# Patient Record
Sex: Female | Born: 1960 | Race: Black or African American | Hispanic: No | Marital: Married | Smoking: Former smoker
Health system: Southern US, Community
[De-identification: ages and names within clinical notes are randomized; demographics above are authoritative.]

## PROBLEM LIST (undated history)

## (undated) HISTORY — PX: TUBAL LIGATION: SHX77

---

## 2000-08-07 ENCOUNTER — Other Ambulatory Visit: Admission: RE | Admit: 2000-08-07 | Discharge: 2000-08-07 | Payer: Self-pay | Admitting: *Deleted

## 2000-08-16 ENCOUNTER — Ambulatory Visit (HOSPITAL_COMMUNITY): Admission: RE | Admit: 2000-08-16 | Discharge: 2000-08-16 | Payer: Self-pay | Admitting: *Deleted

## 2000-08-16 ENCOUNTER — Encounter: Payer: Self-pay | Admitting: Family Medicine

## 2001-11-26 ENCOUNTER — Encounter: Payer: Self-pay | Admitting: Family Medicine

## 2001-11-26 ENCOUNTER — Ambulatory Visit (HOSPITAL_COMMUNITY): Admission: RE | Admit: 2001-11-26 | Discharge: 2001-11-26 | Payer: Self-pay | Admitting: Family Medicine

## 2002-12-11 ENCOUNTER — Ambulatory Visit (HOSPITAL_COMMUNITY): Admission: RE | Admit: 2002-12-11 | Discharge: 2002-12-11 | Payer: Self-pay | Admitting: Family Medicine

## 2003-01-16 ENCOUNTER — Emergency Department (HOSPITAL_COMMUNITY): Admission: EM | Admit: 2003-01-16 | Discharge: 2003-01-16 | Payer: Self-pay | Admitting: Emergency Medicine

## 2004-01-15 ENCOUNTER — Ambulatory Visit (HOSPITAL_COMMUNITY): Admission: RE | Admit: 2004-01-15 | Discharge: 2004-01-15 | Payer: Self-pay | Admitting: Family Medicine

## 2004-01-15 ENCOUNTER — Other Ambulatory Visit: Admission: RE | Admit: 2004-01-15 | Discharge: 2004-01-15 | Payer: Self-pay | Admitting: Family Medicine

## 2005-09-07 ENCOUNTER — Ambulatory Visit (HOSPITAL_COMMUNITY): Admission: RE | Admit: 2005-09-07 | Discharge: 2005-09-07 | Payer: Self-pay | Admitting: Family Medicine

## 2005-09-13 ENCOUNTER — Other Ambulatory Visit: Admission: RE | Admit: 2005-09-13 | Discharge: 2005-09-13 | Payer: Self-pay | Admitting: Family Medicine

## 2005-09-14 ENCOUNTER — Encounter: Admission: RE | Admit: 2005-09-14 | Discharge: 2005-09-14 | Payer: Self-pay | Admitting: Family Medicine

## 2006-09-10 ENCOUNTER — Ambulatory Visit (HOSPITAL_COMMUNITY): Admission: RE | Admit: 2006-09-10 | Discharge: 2006-09-10 | Payer: Self-pay | Admitting: Family Medicine

## 2006-10-05 ENCOUNTER — Other Ambulatory Visit: Admission: RE | Admit: 2006-10-05 | Discharge: 2006-10-05 | Payer: Self-pay | Admitting: Family Medicine

## 2006-10-12 ENCOUNTER — Ambulatory Visit (HOSPITAL_COMMUNITY): Admission: RE | Admit: 2006-10-12 | Discharge: 2006-10-12 | Payer: Self-pay | Admitting: Family Medicine

## 2007-02-20 ENCOUNTER — Ambulatory Visit (HOSPITAL_COMMUNITY): Admission: RE | Admit: 2007-02-20 | Discharge: 2007-02-20 | Payer: Self-pay | Admitting: Family Medicine

## 2007-09-20 ENCOUNTER — Ambulatory Visit (HOSPITAL_COMMUNITY): Admission: RE | Admit: 2007-09-20 | Discharge: 2007-09-20 | Payer: Self-pay | Admitting: Family Medicine

## 2007-09-27 ENCOUNTER — Encounter: Admission: RE | Admit: 2007-09-27 | Discharge: 2007-09-27 | Payer: Self-pay | Admitting: Family Medicine

## 2007-10-07 ENCOUNTER — Other Ambulatory Visit: Admission: RE | Admit: 2007-10-07 | Discharge: 2007-10-07 | Payer: Self-pay | Admitting: Family Medicine

## 2008-03-16 ENCOUNTER — Encounter: Admission: RE | Admit: 2008-03-16 | Discharge: 2008-03-16 | Payer: Self-pay | Admitting: Family Medicine

## 2008-10-30 ENCOUNTER — Other Ambulatory Visit: Admission: RE | Admit: 2008-10-30 | Discharge: 2008-10-30 | Payer: Self-pay | Admitting: Family Medicine

## 2008-11-06 ENCOUNTER — Ambulatory Visit (HOSPITAL_COMMUNITY): Admission: RE | Admit: 2008-11-06 | Discharge: 2008-11-06 | Payer: Self-pay | Admitting: Family Medicine

## 2009-11-01 ENCOUNTER — Other Ambulatory Visit: Admission: RE | Admit: 2009-11-01 | Discharge: 2009-11-01 | Payer: Self-pay | Admitting: Family Medicine

## 2010-02-27 ENCOUNTER — Encounter: Payer: Self-pay | Admitting: Family Medicine

## 2010-03-10 ENCOUNTER — Other Ambulatory Visit: Payer: Self-pay | Admitting: Family Medicine

## 2010-03-10 DIAGNOSIS — Z1239 Encounter for other screening for malignant neoplasm of breast: Secondary | ICD-10-CM

## 2010-03-23 ENCOUNTER — Ambulatory Visit
Admission: RE | Admit: 2010-03-23 | Discharge: 2010-03-23 | Disposition: A | Discharge status: Home / Self Care | Payer: BC Managed Care – PPO | Source: Ambulatory Visit | Attending: Family Medicine | Admitting: Family Medicine

## 2010-03-23 DIAGNOSIS — Z1239 Encounter for other screening for malignant neoplasm of breast: Secondary | ICD-10-CM

## 2010-11-21 ENCOUNTER — Other Ambulatory Visit: Payer: Self-pay | Admitting: Family Medicine

## 2010-11-21 ENCOUNTER — Other Ambulatory Visit (HOSPITAL_COMMUNITY)
Admission: RE | Admit: 2010-11-21 | Discharge: 2010-11-21 | Disposition: A | Discharge status: Home / Self Care | Payer: BC Managed Care – PPO | Source: Ambulatory Visit | Attending: Family Medicine | Admitting: Family Medicine

## 2010-11-21 DIAGNOSIS — Z01419 Encounter for gynecological examination (general) (routine) without abnormal findings: Secondary | ICD-10-CM | POA: Insufficient documentation

## 2011-07-07 ENCOUNTER — Other Ambulatory Visit: Payer: Self-pay | Admitting: Family Medicine

## 2011-10-25 ENCOUNTER — Other Ambulatory Visit: Payer: Self-pay | Admitting: Family Medicine

## 2011-10-25 DIAGNOSIS — Z1231 Encounter for screening mammogram for malignant neoplasm of breast: Secondary | ICD-10-CM

## 2011-11-21 ENCOUNTER — Ambulatory Visit
Admission: RE | Admit: 2011-11-21 | Discharge: 2011-11-21 | Disposition: A | Discharge status: Home / Self Care | Payer: BC Managed Care – PPO | Source: Ambulatory Visit | Attending: Family Medicine | Admitting: Family Medicine

## 2011-11-21 DIAGNOSIS — Z1231 Encounter for screening mammogram for malignant neoplasm of breast: Secondary | ICD-10-CM

## 2011-12-05 ENCOUNTER — Other Ambulatory Visit (HOSPITAL_COMMUNITY)
Admission: RE | Admit: 2011-12-05 | Discharge: 2011-12-05 | Disposition: A | Discharge status: Home / Self Care | Payer: BC Managed Care – PPO | Source: Ambulatory Visit | Attending: Family Medicine | Admitting: Family Medicine

## 2011-12-05 ENCOUNTER — Other Ambulatory Visit: Payer: Self-pay | Admitting: Family Medicine

## 2011-12-05 DIAGNOSIS — Z01419 Encounter for gynecological examination (general) (routine) without abnormal findings: Secondary | ICD-10-CM | POA: Insufficient documentation

## 2013-11-17 ENCOUNTER — Other Ambulatory Visit: Payer: Self-pay

## 2013-11-17 DIAGNOSIS — Z1239 Encounter for other screening for malignant neoplasm of breast: Secondary | ICD-10-CM

## 2013-11-27 ENCOUNTER — Other Ambulatory Visit: Payer: Self-pay

## 2013-11-27 DIAGNOSIS — Z1231 Encounter for screening mammogram for malignant neoplasm of breast: Secondary | ICD-10-CM

## 2013-12-01 ENCOUNTER — Ambulatory Visit
Admission: RE | Admit: 2013-12-01 | Discharge: 2013-12-01 | Disposition: A | Discharge status: Home / Self Care | Payer: BC Managed Care – PPO | Source: Ambulatory Visit

## 2013-12-01 DIAGNOSIS — Z1231 Encounter for screening mammogram for malignant neoplasm of breast: Secondary | ICD-10-CM

## 2014-07-24 ENCOUNTER — Emergency Department (HOSPITAL_COMMUNITY)
Admission: EM | Admit: 2014-07-24 | Discharge: 2014-07-24 | Disposition: A | Discharge status: Home / Self Care | Payer: BC Managed Care – PPO | Attending: Emergency Medicine | Admitting: Emergency Medicine

## 2014-07-24 ENCOUNTER — Encounter (HOSPITAL_COMMUNITY): Payer: Self-pay

## 2014-07-24 DIAGNOSIS — R110 Nausea: Secondary | ICD-10-CM | POA: Diagnosis not present

## 2014-07-24 DIAGNOSIS — R42 Dizziness and giddiness: Secondary | ICD-10-CM | POA: Diagnosis present

## 2014-07-24 DIAGNOSIS — Z87891 Personal history of nicotine dependence: Secondary | ICD-10-CM | POA: Insufficient documentation

## 2014-07-24 DIAGNOSIS — H8110 Benign paroxysmal vertigo, unspecified ear: Secondary | ICD-10-CM | POA: Diagnosis not present

## 2014-07-24 LAB — I-STAT CHEM 8, ED
BUN: 10 mg/dL (ref 6–20)
CHLORIDE: 106 mmol/L (ref 101–111)
Calcium, Ion: 1.19 mmol/L (ref 1.12–1.23)
Creatinine, Ser: 0.6 mg/dL (ref 0.44–1.00)
Glucose, Bld: 95 mg/dL (ref 65–99)
HCT: 42 % (ref 36.0–46.0)
Hemoglobin: 14.3 g/dL (ref 12.0–15.0)
POTASSIUM: 3.6 mmol/L (ref 3.5–5.1)
SODIUM: 141 mmol/L (ref 135–145)
TCO2: 23 mmol/L (ref 0–100)

## 2014-07-24 MED ORDER — SODIUM CHLORIDE 0.9 % IV BOLUS (SEPSIS)
1000.0000 mL | Freq: Once | INTRAVENOUS | Status: AC
  Administered 2014-07-24: 1000 mL via INTRAVENOUS

## 2014-07-24 MED ORDER — DIAZEPAM 5 MG/ML IJ SOLN
5.0000 mg | Freq: Once | INTRAMUSCULAR | Status: AC
  Administered 2014-07-24: 5 mg via INTRAVENOUS
  Filled 2014-07-24: qty 2

## 2014-07-24 MED ORDER — MECLIZINE HCL 50 MG PO TABS: 50.0000 mg | ORAL_TABLET | Freq: Two times a day (BID) | ORAL | Status: AC | PRN

## 2014-07-24 MED ORDER — PROMETHAZINE HCL 25 MG PO TABS: 25.0000 mg | ORAL_TABLET | Freq: Four times a day (QID) | ORAL | Status: AC | PRN

## 2014-07-24 NOTE — ED Provider Notes (Signed)
The patient is a 54 year old female who presents with acute onset of vertiginous symptoms with moving her head. On exam the patient has reproducible symptoms with moving her head left or right and with sitting up. It fatigues after a couple of minutes of laying still. She has no other focal neurologic deficits, normal strength and sensation, after a while after being medicated she was able to ambulate to the bathroom with minimal assistance. Likely peripheral vertigo, stable for discharge.  After pt was dosed with meds, she improved rapidly and was eventually able to ambulate by herself multiple times down hallway - feels improved for d/c.  Counseled pt re: diagnosis and indications for return.  Medical screening examination/treatment/procedure(s) were conducted as a shared visit with non-physician practitioner(s) and myself.  I personally evaluated the patient during the encounter.  Clinical Impression:   Final diagnoses:  None         Eber Hong, MD 07/25/14 [phone]

## 2014-07-24 NOTE — Discharge Instructions (Signed)
Please follow with your primary care doctor in the next 2 days for a check-up. They must obtain records for further management.  ° °Do not hesitate to return to the Emergency Department for any new, worsening or concerning symptoms.  ° ° °Benign Positional Vertigo °Vertigo means you feel like you or your surroundings are moving when they are not. Benign positional vertigo is the most common form of vertigo. Benign means that the cause of your condition is not serious. Benign positional vertigo is more common in older adults. °CAUSES  °Benign positional vertigo is the result of an upset in the labyrinth system. This is an area in the middle ear that helps control your balance. This may be caused by a viral infection, head injury, or repetitive motion. However, often no specific cause is found. °SYMPTOMS  °Symptoms of benign positional vertigo occur when you move your head or eyes in different directions. Some of the symptoms may include: °· Loss of balance and falls. °· Vomiting. °· Blurred vision. °· Dizziness. °· Nausea. °· Involuntary eye movements (nystagmus). °DIAGNOSIS  °Benign positional vertigo is usually diagnosed by physical exam. If the specific cause of your benign positional vertigo is unknown, your caregiver may perform imaging tests, such as magnetic resonance imaging (MRI) or computed tomography (CT). °TREATMENT  °Your caregiver may recommend movements or procedures to correct the benign positional vertigo. Medicines such as meclizine, benzodiazepines, and medicines for nausea may be used to treat your symptoms. In rare cases, if your symptoms are caused by certain conditions that affect the inner ear, you may need surgery. °HOME CARE INSTRUCTIONS  °· Follow your caregiver's instructions. °· Move slowly. Do not make sudden body or head movements. °· Avoid driving. °· Avoid operating heavy machinery. °· Avoid performing any tasks that would be dangerous to you or others during a vertigo  episode. °· Drink enough fluids to keep your urine clear or pale yellow. °SEEK IMMEDIATE MEDICAL CARE IF:  °· You develop problems with walking, weakness, numbness, or using your arms, hands, or legs. °· You have difficulty speaking. °· You develop severe headaches. °· Your nausea or vomiting continues or gets worse. °· You develop visual changes. °· Your family or friends notice any behavioral changes. °· Your condition gets worse. °· You have a fever. °· You develop a stiff neck or sensitivity to light. °MAKE SURE YOU:  °· Understand these instructions. °· Will watch your condition. °· Will get help right away if you are not doing well or get worse. °Document Released: 10/31/2005 Document Revised: 04/17/2011 Document Reviewed: 10/13/2010 °[employer]® Patient Information ©2015 [employer]. This information is not intended to replace advice given to you by your health care provider. Make sure you discuss any questions you have with your health care provider. ° °

## 2014-07-24 NOTE — ED Notes (Signed)
[facility]- pt coming from home with sudden onset of dizziness. Pt denies headache. Pt a&o X4. No PMH. Vital signs stable.

## 2014-07-24 NOTE — ED Notes (Signed)
Pt ambulated to restroom and back to her room with no assistance. Pt tolerated well.

## 2014-07-24 NOTE — ED Notes (Signed)
Pt verbalizes understanding of d/c instructions and denies any further needs at this time. 

## 2014-07-24 NOTE — ED Provider Notes (Signed)
CSN: [id number]     Arrival date & time 07/24/14  1352 History   First MD Initiated Contact with Patient 07/24/14 1406     Chief Complaint  Patient presents with  . Dizziness     (Consider location/radiation/quality/duration/timing/severity/associated sxs/prior Treatment) HPI   Blood pressure 130/51, pulse 76, temperature 98.9 F (37.2 C), temperature source Oral, resp. rate 21, SpO2 100 %.  Elaine Carroll is a 54 y.o. female complaining of acute onset of dizziness which she describes as at noon today when she was in the grocery store. States she has nausea with no vomiting. She's never had a similar episode, patient denies headache, change in vision, dysarthria, cervicalgia, tinnitus, change in hearing, chest pain, shortness of breath, unilateral weakness. Patient states that the symptoms are alleviated when she closes her eyes and stays very still. She has no family history of CVA, she is a former smoker, nondiabetic no history of hypertension or hyperlipidemia.  History reviewed. No pertinent past medical history. Past Surgical History  Procedure Laterality Date  . Tubal ligation     History reviewed. No pertinent family history. History  Substance Use Topics  . Smoking status: Former Smoker    Quit date: 02/06/1974  . Smokeless tobacco: Not on file  . Alcohol Use: Yes     Comment: Occasional    OB History    No data available     Review of Systems  10 systems reviewed and found to be negative, except as noted in the HPI.   Allergies  Review of patient's allergies indicates no known allergies.  Home Medications   Prior to Admission medications   Medication Sig Start Date End Date Taking? Authorizing Provider  meclizine (ANTIVERT) 50 MG tablet Take 1 tablet (50 mg total) by mouth 2 (two) times daily as needed for dizziness or nausea. 07/24/14   Charmane Protzman, PA-C  promethazine (PHENERGAN) 25 MG tablet Take 1 tablet (25 mg total) by mouth every 6 (six) hours as  needed for nausea or vomiting. 07/24/14   Joni Reining Lakshmi Sundeen, PA-C   BP 114/53 mmHg  Pulse 80  Temp(Src) 98.9 F (37.2 C) (Oral)  Resp 17  SpO2 96% Physical Exam  Constitutional: She is oriented to person, place, and time. She appears well-developed and well-nourished.  HENT:  Head: Normocephalic and atraumatic.  Mouth/Throat: Oropharynx is clear and moist.  Eyes: Conjunctivae and EOM are normal. Pupils are equal, round, and reactive to light.  No TTP of maxillary or frontal sinuses  No TTP or induration of temporal arteries bilaterally  Neck: Normal range of motion. Neck supple.  FROM to C-spine. Pt can touch chin to chest without discomfort. No TTP of midline cervical spine.   Cardiovascular: Normal rate, regular rhythm and intact distal pulses.   Pulmonary/Chest: Effort normal and breath sounds normal. No respiratory distress. She has no wheezes. She has no rales. She exhibits no tenderness.  Abdominal: Soft. Bowel sounds are normal. There is no tenderness.  Musculoskeletal: Normal range of motion. She exhibits no edema or tenderness.  Neurological: She is alert and oriented to person, place, and time. No cranial nerve deficit.  In acute distress, crying, in fetal position with her eyes closed, resist head movements.  II-Visual fields grossly intact. III/IV/VI-Extraocular movements intact.  Pupils reactive bilaterally. V/VII-Smile symmetric, equal eyebrow raise,  facial sensation intact VIII- Hearing grossly intact IX/X-Normal gag XI-bilateral shoulder shrug XII-midline tongue extension Motor: 5/5 bilaterally with normal tone and bulk Cerebellar:  Gait is not evaluated  Nursing note and vitals reviewed.   ED Course  Procedures (including critical care time) Labs Review Labs Reviewed  I-STAT CHEM 8, ED    Imaging Review No results found.   EKG Interpretation   Date/Time:  Friday July 24 2014 13:55:28 EDT Ventricular Rate:  79 PR Interval:  141 QRS Duration:  81 QT Interval:  360 QTC Calculation: 413 R Axis:   74 Text Interpretation:  Sinus rhythm Probable left atrial enlargement Left  ventricular hypertrophy Baseline wander in lead(s) III No old tracing to  compare Confirmed by MILLER  MD, BRIAN ([id number]) on 07/24/2014 4:05:50 PM      MDM   Final diagnoses:  BPPV (benign paroxysmal positional vertigo), unspecified laterality    Filed Vitals:   07/24/14 1357 07/24/14 1400 07/24/14 1445 07/24/14 1526  BP: 100/79 125/87 130/51 114/53  Pulse: 89 78 76 80  Temp: 98.9 F (37.2 C)     TempSrc: Oral     Resp: SpO2: 100% 100% 100% 96%    Medications  sodium chloride 0.9 % bolus 1,000 mL (1,000 mLs Intravenous New Bag/Given 07/24/14 1521)  diazepam (VALIUM) injection 5 mg (5 mg Intravenous Given 07/24/14 1512)    Elaine Carroll is a pleasant 54 y.o. female presenting with acute, isolated vertigo with associated nausea. Consistent with peripheral vertigo. Because this happened within 3 hours, after attending physician to evaluate the patient for the unlikely event that he wanted to call a code stroke. Dr. Hyacinth Meeker agrees is a peripheral vertigo, will give fluids, IV Ativan.  Patient reassessed and she reports improvement in symptoms. We'll have a trial of ambulation.  Patient is ambulating without issue, moving her head no significantly more comfortable. We'll advise her to follow with primary care, will write for meclizine and Phenergan.  Evaluation does not show pathology that would require ongoing emergent intervention or inpatient treatment. Pt is hemodynamically stable and mentating appropriately. Discussed findings and plan with patient/guardian, who agrees with care plan. All questions answered. Return precautions discussed and outpatient follow up given.   New Prescriptions   MECLIZINE (ANTIVERT) 50 MG TABLET    Take 1 tablet (50 mg total) by mouth 2 (two) times daily as needed for dizziness or nausea.   PROMETHAZINE  (PHENERGAN) 25 MG TABLET    Take 1 tablet (25 mg total) by mouth every 6 (six) hours as needed for nausea or vomiting.         Wynetta Emery, PA-C 07/24/14 1610  Eber Hong, MD 07/25/14 [phone]

## 2016-02-16 DIAGNOSIS — H10023 Other mucopurulent conjunctivitis, bilateral: Secondary | ICD-10-CM | POA: Diagnosis not present

## 2016-02-16 DIAGNOSIS — J029 Acute pharyngitis, unspecified: Secondary | ICD-10-CM | POA: Diagnosis not present

## 2016-02-16 DIAGNOSIS — Z23 Encounter for immunization: Secondary | ICD-10-CM | POA: Diagnosis not present

## 2016-08-15 ENCOUNTER — Other Ambulatory Visit: Payer: Self-pay | Admitting: Family Medicine

## 2016-08-15 DIAGNOSIS — Z1231 Encounter for screening mammogram for malignant neoplasm of breast: Secondary | ICD-10-CM

## 2016-08-22 ENCOUNTER — Ambulatory Visit
Admission: RE | Admit: 2016-08-22 | Discharge: 2016-08-22 | Disposition: A | Discharge status: Home / Self Care | Payer: BC Managed Care – PPO | Source: Ambulatory Visit | Attending: Family Medicine | Admitting: Family Medicine

## 2016-08-22 DIAGNOSIS — Z1231 Encounter for screening mammogram for malignant neoplasm of breast: Secondary | ICD-10-CM

## 2016-11-08 ENCOUNTER — Other Ambulatory Visit: Payer: Self-pay | Admitting: Family Medicine

## 2016-11-08 ENCOUNTER — Other Ambulatory Visit (HOSPITAL_COMMUNITY)
Admission: RE | Admit: 2016-11-08 | Discharge: 2016-11-08 | Disposition: A | Discharge status: Home / Self Care | Payer: BC Managed Care – PPO | Source: Ambulatory Visit | Attending: Family Medicine | Admitting: Family Medicine

## 2016-11-08 DIAGNOSIS — Z0000 Encounter for general adult medical examination without abnormal findings: Secondary | ICD-10-CM | POA: Insufficient documentation

## 2016-11-09 LAB — CYTOLOGY - PAP
DIAGNOSIS: NEGATIVE
HPV: NOT DETECTED

## 2018-03-07 DIAGNOSIS — H524 Presbyopia: Secondary | ICD-10-CM | POA: Diagnosis not present

## 2018-11-04 ENCOUNTER — Other Ambulatory Visit: Payer: Self-pay | Admitting: Family Medicine

## 2018-11-04 DIAGNOSIS — Z1231 Encounter for screening mammogram for malignant neoplasm of breast: Secondary | ICD-10-CM

## 2018-11-05 ENCOUNTER — Other Ambulatory Visit: Payer: Self-pay

## 2018-11-05 ENCOUNTER — Ambulatory Visit: Payer: BC Managed Care – PPO

## 2018-11-05 ENCOUNTER — Ambulatory Visit
Admission: RE | Admit: 2018-11-05 | Discharge: 2018-11-05 | Disposition: A | Discharge status: Home / Self Care | Payer: BC Managed Care – PPO | Source: Ambulatory Visit | Attending: Family Medicine | Admitting: Family Medicine

## 2018-11-05 DIAGNOSIS — Z1231 Encounter for screening mammogram for malignant neoplasm of breast: Secondary | ICD-10-CM

## 2021-07-07 ENCOUNTER — Encounter (HOSPITAL_BASED_OUTPATIENT_CLINIC_OR_DEPARTMENT_OTHER): Payer: Self-pay

## 2021-07-07 ENCOUNTER — Emergency Department (HOSPITAL_BASED_OUTPATIENT_CLINIC_OR_DEPARTMENT_OTHER)
Admission: EM | Admit: 2021-07-07 | Discharge: 2021-07-07 | Disposition: A | Discharge status: Home / Self Care | Payer: BC Managed Care – PPO | Attending: Emergency Medicine | Admitting: Emergency Medicine

## 2021-07-07 DIAGNOSIS — M545 Low back pain, unspecified: Secondary | ICD-10-CM | POA: Insufficient documentation

## 2021-07-07 LAB — URINALYSIS, ROUTINE W REFLEX MICROSCOPIC
Bilirubin Urine: NEGATIVE
Glucose, UA: NEGATIVE mg/dL
Hgb urine dipstick: NEGATIVE
Ketones, ur: NEGATIVE mg/dL
Nitrite: NEGATIVE
Specific Gravity, Urine: 1.033 — ABNORMAL HIGH (ref 1.005–1.030)
pH: 7 (ref 5.0–8.0)

## 2021-07-07 MED ORDER — DEXAMETHASONE SODIUM PHOSPHATE 10 MG/ML IJ SOLN: 10.0000 mg | Freq: Once | INTRAMUSCULAR | Status: DC

## 2021-07-07 MED ORDER — ACETAMINOPHEN 500 MG PO TABS
1000.0000 mg | ORAL_TABLET | Freq: Once | ORAL | Status: AC
  Administered 2021-07-07: 1000 mg via ORAL
  Filled 2021-07-07: qty 2

## 2021-07-07 MED ORDER — OXYCODONE HCL 5 MG PO TABS
5.0000 mg | ORAL_TABLET | Freq: Once | ORAL | Status: AC
  Administered 2021-07-07: 5 mg via ORAL
  Filled 2021-07-07: qty 1

## 2021-07-07 MED ORDER — CYCLOBENZAPRINE HCL 10 MG PO TABS: 10.0000 mg | ORAL_TABLET | Freq: Two times a day (BID) | ORAL | 0 refills | Status: AC | PRN

## 2021-07-07 MED ORDER — KETOROLAC TROMETHAMINE 60 MG/2ML IM SOLN
60.0000 mg | Freq: Once | INTRAMUSCULAR | Status: AC
  Administered 2021-07-07: 60 mg via INTRAMUSCULAR
  Filled 2021-07-07: qty 2

## 2021-07-07 MED ORDER — METHYLPREDNISOLONE 4 MG PO TBPK: ORAL_TABLET | ORAL | 0 refills | Status: AC

## 2021-07-07 MED ORDER — DEXAMETHASONE SODIUM PHOSPHATE 10 MG/ML IJ SOLN
10.0000 mg | Freq: Once | INTRAMUSCULAR | Status: AC
  Administered 2021-07-07: 10 mg via INTRAMUSCULAR
  Filled 2021-07-07: qty 1

## 2021-07-07 NOTE — ED Triage Notes (Signed)
Pt states that she woke up this morning with L sided back pain, denies nausea or urinary symptoms.

## 2021-07-08 NOTE — ED Provider Notes (Signed)
[facility] EMERGENCY DEPT Provider Note   CSN: [id number] Arrival date & time: 07/07/21  1946     History  Chief Complaint  Patient presents with   Sciatica    Elaine Carroll is a 61 y.o. female.  HPI     61yo female presents with concern for left sided back pain. Woke up with it this morning. Severe pain, worse with movements. Certain small movements, twisting, bending, changing positions from sitting to standing or standing to sitting makes it worse. Has been continuing to stand because the change to sitting has been too severe. No numbness, weakness, loss of control of your bowel or bladder. No fevers, trauma, hx of cancer or IVDU.  No hx of back pain.  Cant think of any recent overuse.  No abdominal pain, dysuria or hematuria.  Pain sitting over left side of back, radiates up and down in smaller area, not going down leg.    History reviewed. No pertinent past medical history.   Home Medications Prior to Admission medications   Medication Sig Start Date End Date Taking? Authorizing Provider  cyclobenzaprine (FLEXERIL) 10 MG tablet Take 1 tablet (10 mg total) by mouth 2 (two) times daily as needed for muscle spasms. 07/07/21  Yes Alvira Monday, MD  methylPREDNISolone (MEDROL DOSEPAK) 4 MG TBPK tablet See package instructions 07/07/21  Yes Alvira Monday, MD  meclizine (ANTIVERT) 50 MG tablet Take 1 tablet (50 mg total) by mouth 2 (two) times daily as needed for dizziness or nausea. 07/24/14   Pisciotta, Joni Reining, PA-C  promethazine (PHENERGAN) 25 MG tablet Take 1 tablet (25 mg total) by mouth every 6 (six) hours as needed for nausea or vomiting. 07/24/14   Pisciotta, Joni Reining, PA-C      Allergies    Patient has no known allergies.    Review of Systems   Review of Systems  Physical Exam Updated Vital Signs BP (!) 148/77   Pulse 77   Temp 98.6 F (37 C) (Oral)   Resp 20   SpO2 100%  Physical Exam Vitals and nursing note reviewed.  Constitutional:       General: She is not in acute distress.    Appearance: She is well-developed. She is not diaphoretic.  HENT:     Head: Normocephalic and atraumatic.  Eyes:     Conjunctiva/sclera: Conjunctivae normal.  Cardiovascular:     Rate and Rhythm: Normal rate and regular rhythm.     Pulses: Normal pulses.  Pulmonary:     Effort: Pulmonary effort is normal. No respiratory distress.  Abdominal:     General: There is no distension.     Palpations: Abdomen is soft.     Tenderness: There is no abdominal tenderness. There is no guarding.  Musculoskeletal:        General: Tenderness (left lower back) present.     Cervical back: Normal range of motion.  Skin:    General: Skin is warm and dry.     Findings: No erythema or rash.  Neurological:     Mental Status: She is alert and oriented to person, place, and time.    ED Results / Procedures / Treatments   Labs (all labs ordered are listed, but only abnormal results are displayed) Labs Reviewed  URINALYSIS, ROUTINE W REFLEX MICROSCOPIC - Abnormal; Notable for the following components:      Result Value   Specific Gravity, Urine 1.033 (*)    Protein, ur TRACE (*)    Leukocytes,Ua SMALL (*)  All other components within normal limits    EKG None  Radiology No results found.  Procedures Procedures    Medications Ordered in ED Medications  ketorolac (TORADOL) injection 60 mg (60 mg Intramuscular Given 07/07/21 2323)  oxyCODONE (Oxy IR/ROXICODONE) immediate release tablet 5 mg (5 mg Oral Given 07/07/21 2322)  acetaminophen (TYLENOL) tablet 1,000 mg (1,000 mg Oral Given 07/07/21 2322)  dexamethasone (DECADRON) injection 10 mg (10 mg Intramuscular Given 07/07/21 2324)    ED Course/ Medical Decision Making/ A&P                           Medical Decision Making Amount and/or Complexity of Data Reviewed Labs: ordered.  Risk OTC drugs. Prescription drug management.   61yo female presents with concern for left sided back pain.  Patient  has a normal neurologic exam and denies any urinary retention or overflow incontinence, stool incontinence, saddle anesthesia, fever, IV drug use, trauma, chronic steroid use or immunocompromise and have low suspicion suspicion for cauda equina, fracture, epidural abscess, or vertebral osteomyelitis. Normal pulses, no abdominal pain, dysuria, hematuria, pain worse with movements and not consistent with nephrolithiasis, or dissection.  Given pain worse with movements ans sharp nature suspect most likely lumbar msk pain, likely disc herniation.  Given rx for medrol dose pack, flexeril, recommend ibupfen/tylenol/lidocaine patch and close PCP follow up. Patient discharged in stable condition with understanding of reasons to return.         Final Clinical Impression(s) / ED Diagnoses Final diagnoses:  Acute left-sided low back pain without sciatica    Rx / DC Orders ED Discharge Orders          Ordered    cyclobenzaprine (FLEXERIL) 10 MG tablet  2 times daily PRN        07/07/21 2311    methylPREDNISolone (MEDROL DOSEPAK) 4 MG TBPK tablet        07/07/21 2311              Alvira Monday, MD 07/08/21 2252

## 2021-09-01 IMAGING — MG MM DIGITAL SCREENING BILAT W/ CAD
4 series · 4 of 4 positions shown · non-contrast
Comparison: Previous exam(s).

CLINICAL DATA: Screening.

EXAM:
DIGITAL SCREENING BILATERAL MAMMOGRAM WITH CAD

[R CC]
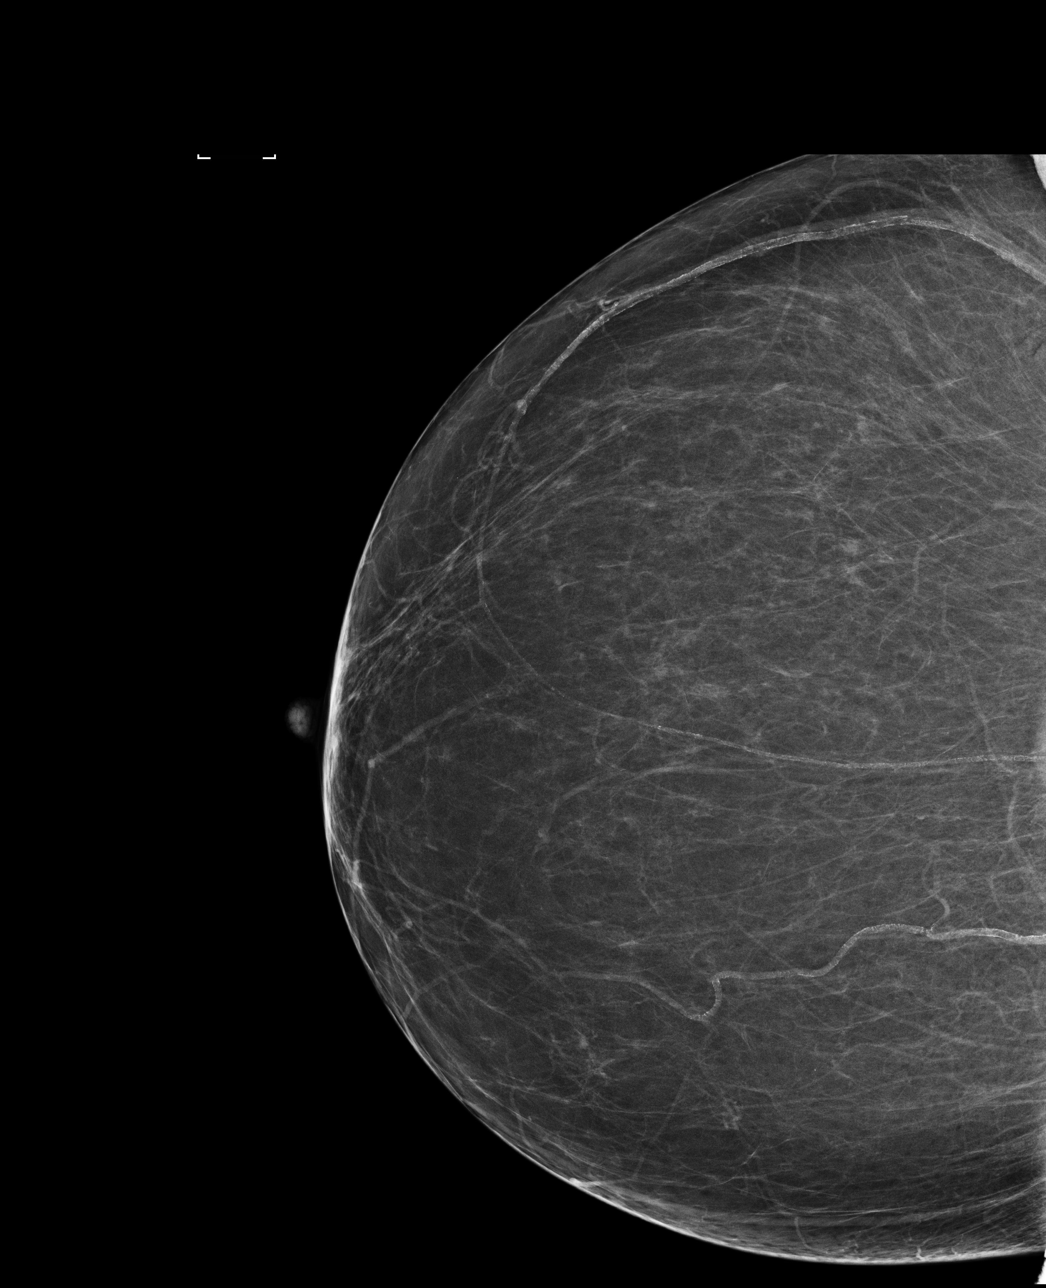

[L MLO]
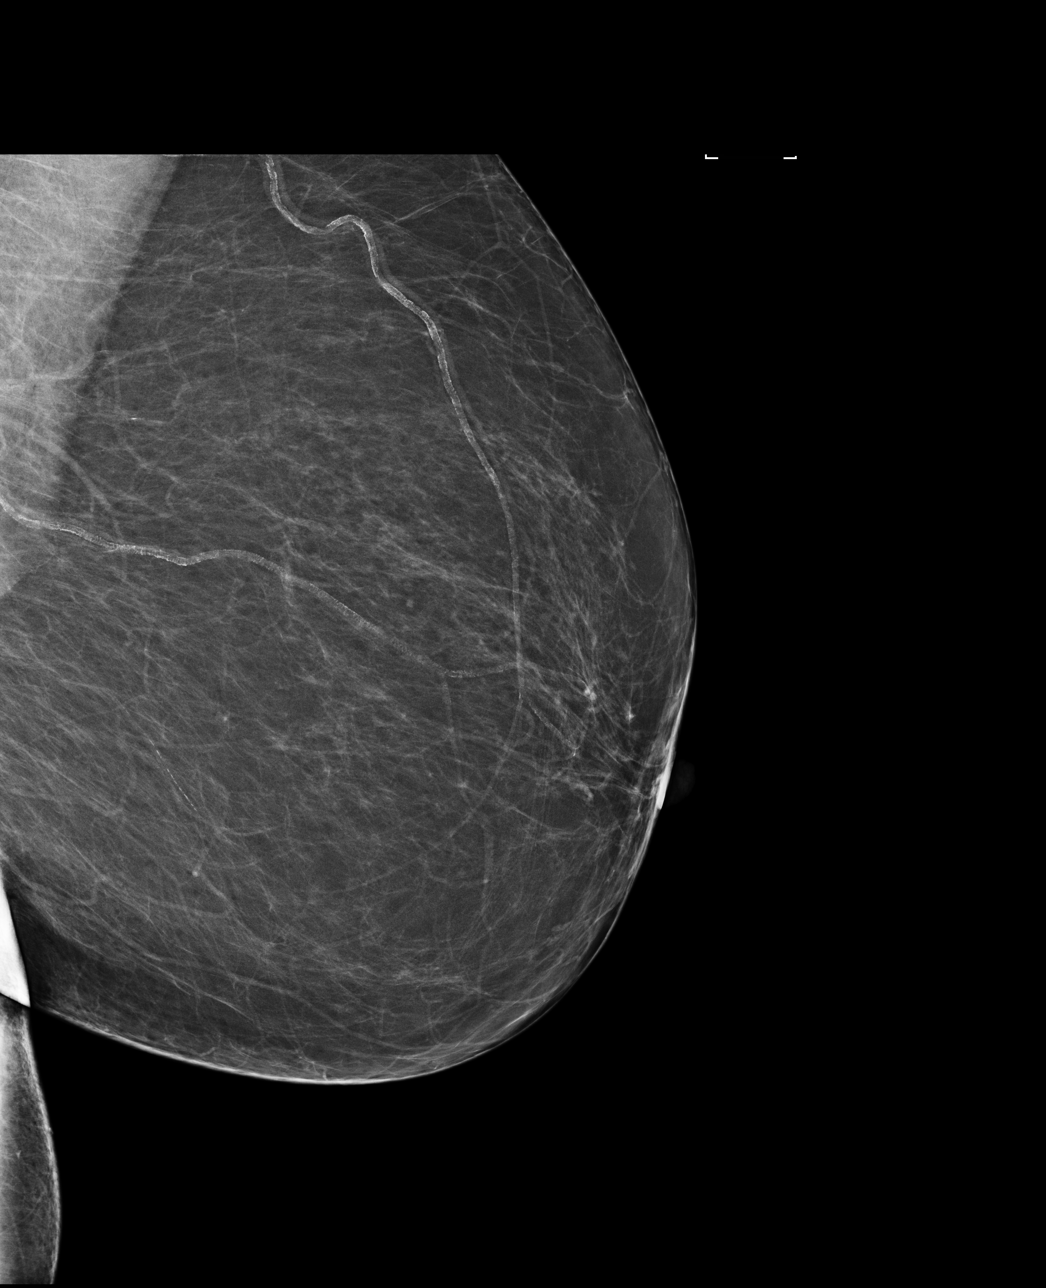

[R MLO]
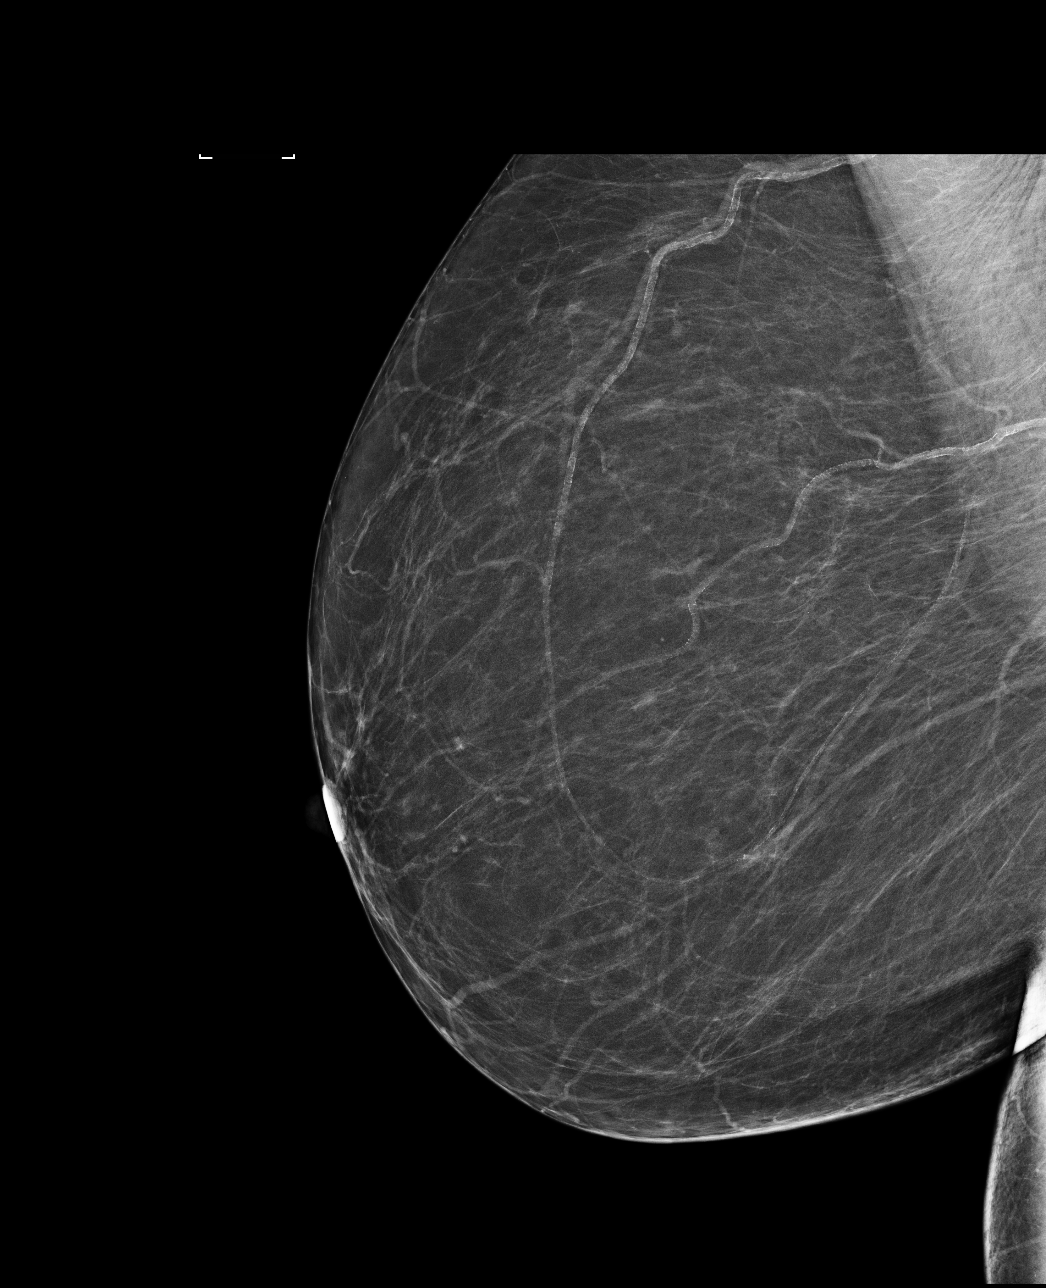

[L CC]
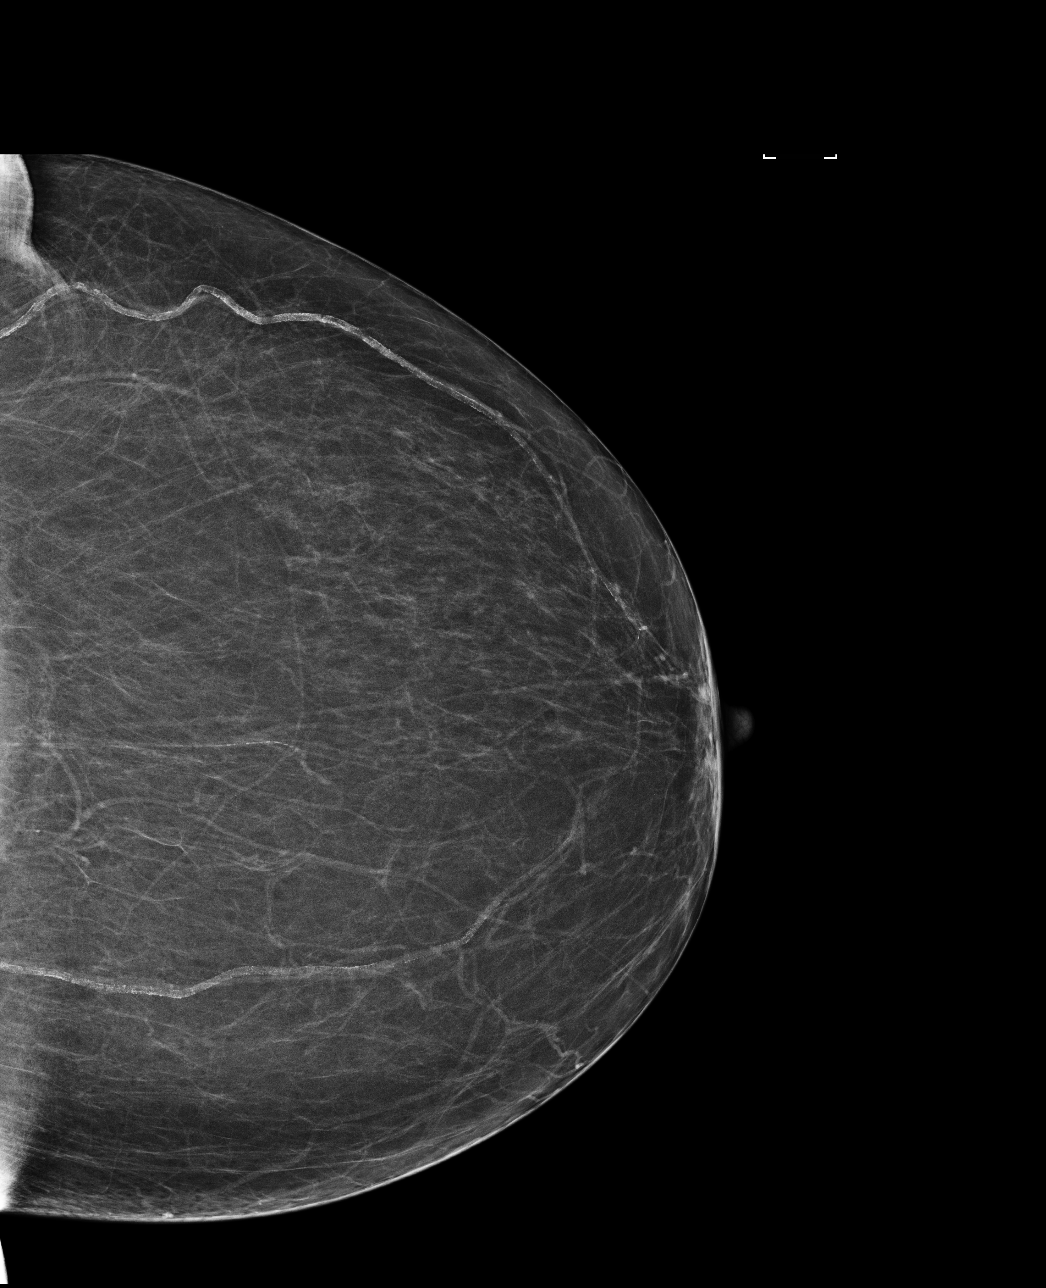

[4 of 4 positions shown; findings below may reference images not displayed]

ACR Breast Density Category b: There are scattered areas of
fibroglandular density.
FINDINGS: There are no findings suspicious for malignancy. Images were
processed with CAD.
IMPRESSION: No mammographic evidence of malignancy. A result letter of this
screening mammogram will be mailed directly to the patient.

RECOMMENDATION:
Screening mammogram in one year. (Code:AS-G-LCT)

BI-RADS CATEGORY  1: Negative.
